# Patient Record
Sex: Male | Born: 1975 | Hispanic: Yes | Marital: Married | Smoking: Current some day smoker
Health system: Southern US, Community
[De-identification: ages and names within clinical notes are randomized; demographics above are authoritative.]

---

## 2018-07-19 ENCOUNTER — Encounter: Payer: Self-pay | Admitting: Emergency Medicine

## 2018-07-19 ENCOUNTER — Emergency Department: Payer: Self-pay

## 2018-07-19 ENCOUNTER — Emergency Department
Admission: EM | Admit: 2018-07-19 | Discharge: 2018-07-19 | Disposition: A | Discharge status: Home / Self Care | Payer: Self-pay | Attending: Emergency Medicine | Admitting: Emergency Medicine

## 2018-07-19 ENCOUNTER — Other Ambulatory Visit: Payer: Self-pay

## 2018-07-19 DIAGNOSIS — Z79899 Other long term (current) drug therapy: Secondary | ICD-10-CM | POA: Insufficient documentation

## 2018-07-19 DIAGNOSIS — F17290 Nicotine dependence, other tobacco product, uncomplicated: Secondary | ICD-10-CM | POA: Insufficient documentation

## 2018-07-19 DIAGNOSIS — R51 Headache: Secondary | ICD-10-CM | POA: Insufficient documentation

## 2018-07-19 DIAGNOSIS — M792 Neuralgia and neuritis, unspecified: Secondary | ICD-10-CM | POA: Insufficient documentation

## 2018-07-19 LAB — CBC
HEMATOCRIT: 42.6 % (ref 39.0–52.0)
HEMOGLOBIN: 14.1 g/dL (ref 13.0–17.0)
MCH: 28.5 pg (ref 26.0–34.0)
MCHC: 33.1 g/dL (ref 30.0–36.0)
MCV: 86.1 fL (ref 80.0–100.0)
NRBC: 0 % (ref 0.0–0.2)
PLATELETS: 456 10*3/uL — AB (ref 150–400)
RBC: 4.95 MIL/uL (ref 4.22–5.81)
RDW: 13.4 % (ref 11.5–15.5)
WBC: 8.1 10*3/uL (ref 4.0–10.5)

## 2018-07-19 LAB — BASIC METABOLIC PANEL
ANION GAP: 11 (ref 5–15)
BUN: 10 mg/dL (ref 6–20)
CALCIUM: 8.8 mg/dL — AB (ref 8.9–10.3)
CHLORIDE: 102 mmol/L (ref 98–111)
CO2: 27 mmol/L (ref 22–32)
Creatinine, Ser: 0.7 mg/dL (ref 0.61–1.24)
GFR calc non Af Amer: >60 mL/min (ref >60)
Glucose, Bld: 119 mg/dL — ABNORMAL HIGH (ref 70–99)
POTASSIUM: 3.9 mmol/L (ref 3.5–5.1)
Sodium: 140 mmol/L (ref 135–145)

## 2018-07-19 LAB — TROPONIN I: Troponin I: <0.03 ng/mL (ref <0.03)

## 2018-07-19 MED ORDER — CARBAMAZEPINE 100 MG PO CHEW: 100.0000 mg | CHEWABLE_TABLET | Freq: Two times a day (BID) | ORAL | 0 refills | Status: AC

## 2018-07-19 MED ORDER — CARBAMAZEPINE 100 MG PO CHEW
100.0000 mg | CHEWABLE_TABLET | Freq: Once | ORAL | Status: AC
  Administered 2018-07-19: 100 mg via ORAL
  Filled 2018-07-19 (×2): qty 1

## 2018-07-19 NOTE — ED Notes (Signed)
Spanish interpreter present for discharge instructions.

## 2018-07-19 NOTE — ED Notes (Signed)
MD and PA student at bedside. 

## 2018-07-19 NOTE — ED Provider Notes (Signed)
[facility] Emergency Department Provider Note   ____________________________________________   First MD Initiated Contact with Patient 07/19/18 1401     (approximate)  I have reviewed the triage vital signs and the nursing notes.   HISTORY  Chief Complaint Chest Pain and Headache    HPI Keith Silva is a 42 y.o. male patient reports that about a month ago he was at work at a different employer hit the side of his head and had about 3 days of pain.  He did not go to the ER or see anyone because he does not have any swelling in the pain went away.  This morning as he was getting ready for work at a different employer he developed shooting pain running from just above his ear and behind his ear down into the left shoulder.  This pain lasts a second or 2 is sharp and stabbing and severe and then goes away comes back a few seconds later.  Nothing seems to bring it on or make it worse.  History reviewed. No pertinent past medical history.  There are no active problems to display for this patient.   History reviewed. No pertinent surgical history.  Prior to Admission medications   Medication Sig Start Date End Date Taking? Authorizing Provider  carbamazepine (TEGRETOL) 100 MG chewable tablet Chew 1 tablet (100 mg total) by mouth 2 (two) times daily. Or you can dispense regular Tegretol 1 twice a day of the 100 mg pills 07/19/18   Arnaldo Natal, MD    Allergies Patient has no known allergies.  No family history on file.  Social History Social History   Tobacco Use  . Smoking status: Current Some Day Smoker    Types: Cigars  Substance Use Topics  . Alcohol use: Yes  . Drug use: Never    Review of Systems  Constitutional: No fever/chills Eyes: No visual changes. ENT: No sore throat. Cardiovascular: Denies chest pain. Respiratory: Denies shortness of breath. Gastrointestinal: No abdominal pain.  No nausea, no vomiting.  No diarrhea.  No  constipation. Genitourinary: Negative for dysuria. Musculoskeletal: Negative for back pain. Skin: Negative for rash. Neurological: See HPI   ____________________________________________   PHYSICAL EXAM:  VITAL SIGNS: ED Triage Vitals  Enc Vitals Group     BP 07/19/18 1029 123/75     Pulse Rate 07/19/18 1029 75     Resp 07/19/18 1029 18     Temp 07/19/18 1029 98.5 F (36.9 C)     Temp Source 07/19/18 1029 Oral     SpO2 07/19/18 1029 99 %     Weight 07/19/18 1030 195 lb (88.5 kg)     Height 07/19/18 1030 5\' 5"  (1.651 m)     Head Circumference --      Peak Flow --      Pain Score 07/19/18 1030 8     Pain Loc --      Pain Edu? --      Excl. in GC? --     Constitutional: Alert and oriented. Well appearing and in no acute distress. Eyes: Conjunctivae are normal. PERRL. EOMI. Head: Atraumatic. Nose: No congestion/rhinnorhea. Mouth/Throat: Mucous membranes are moist.  Oropharynx non-erythematous. Neck: No stridor.  Palpation of the neck and side of the head does not reproduce the patient's pain  cardiovascular: Normal rate, regular rhythm. Grossly normal heart sounds.  Good peripheral circulation. Respiratory: Normal respiratory effort.  No retractions. Lungs CTAB. Gastrointestinal: Soft and nontender. No distention. No abdominal bruits.  No CVA tenderness. Musculoskeletal: No lower extremity tenderness nor edema.  No joint effusions. Neurologic:  Normal speech and language. No gross focal neurologic deficits are appreciated.  Cranial nerves II through XII are intact there is no ataxia noted motor strength is normal in arms and legs and patient does not report any numbness no gait instability. Skin:  Skin is warm, dry and intact. No rash noted. Psychiatric: Mood and affect are normal. Speech and behavior are normal.  ____________________________________________   LABS (all labs ordered are listed, but only abnormal results are displayed)  Labs Reviewed  BASIC METABOLIC  PANEL - Abnormal; Notable for the following components:      Result Value   Glucose, Bld 119 (*)    Calcium 8.8 (*)    All other components within normal limits  CBC - Abnormal; Notable for the following components:   Platelets 456 (*)    All other components within normal limits  TROPONIN I   ____________________________________________  EKG  EKG read and interpreted by me shows normal sinus rhythm rate of 68 normal axis no acute ST-T wave changes ____________________________________________  RADIOLOGY  ED MD interpretation: Chest x-ray read by radiology reviewed by me shows some coarse markings however the patient has absolutely no lung symptoms no cough no fever no chills lungs are clear not sure the etiology of these markings but I do not believe there will cause a problem currently. CT read by radiologist only shows some thickening of the bilateral ethmoid sinuses there is no intracranial pathology  Official radiology report(s): Dg Chest 2 View  Result Date: 07/19/2018 CLINICAL DATA:  Intermittent chest pain began around 9:45 a.m. and the patient reports radiation of pain into the neck and left arm with persistent left-sided headache. Current smoker. EXAM: CHEST - 2 VIEW COMPARISON:  None. FINDINGS: The lungs are adequately inflated. The interstitial markings are coarse in the lower lobes greatest on the right. The heart and pulmonary vascularity are normal. There is no pleural effusion. 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 IMPRESSION: Coarse basilar lung markings greatest on the right may reflect subsegmental atelectasis or early pneumonia. No pulmonary edema. No pleural effusion or pneumothorax. Electronically Signed   By: David  [place] M.D.   On: 07/19/2018 10:58   Ct Head Wo Contrast  Result Date: 07/19/2018 CLINICAL DATA:  Left arm and neck pain extending into the left head. EXAM: CT HEAD WITHOUT CONTRAST TECHNIQUE: Contiguous axial images were  obtained from the base of the skull through the vertex without intravenous contrast. COMPARISON:  None. FINDINGS: Brain: No evidence of acute infarction, hemorrhage, hydrocephalus, extra-axial collection or mass lesion/mass effect. Vascular: No hyperdense vessel or unexpected calcification. Skull: Normal. Negative for fracture or focal lesion. Sinuses/Orbits: Minimal mucoperiosteal thickening of bilateral ethmoid sinuses are noted. Other: None. IMPRESSION: No focal acute intracranial abnormality identified. Minimal mucoperiosteal thickening of bilateral ethmoid sinuses. Electronically Signed   By: Sherian Rein M.D.   On: 07/19/2018 14:47    ____________________________________________   PROCEDURES  Procedure(s) performed: Discussed patient with Dr. Thad Ranger.  Because the patient is bothered quite a lot by the shooting headache pains that seem like there consistent with tic douloureux although not in the right place we will begin treatment with Tegretol and have a follow-up.  Procedures  Critical Care performed:   ____________________________________________   INITIAL IMPRESSION / ASSESSMENT AND PLAN / ED COURSE  Reviewed the patient's discharge instructions with him got him the first dose of Tegretol will let  him go he will return for any further problems      ____________________________________________   FINAL CLINICAL IMPRESSION(S) / ED DIAGNOSES  Final diagnoses:  Neuropathic pain     ED Discharge Orders         Ordered    carbamazepine (TEGRETOL) 100 MG chewable tablet  2 times daily     07/19/18 1532           Note:  This document was prepared using Dragon voice recognition software and may include unintentional dictation errors.    Arnaldo Natal, MD 07/19/18 1622

## 2018-07-19 NOTE — ED Triage Notes (Signed)
Patient reports intermittent chest pain that started approximately 945. Reports shooting pain to left side of chest and up left arm into neck and head. Reports constant headache on left side of head. Patient denies N/V or SOB. Denies history of same.

## 2018-07-19 NOTE — ED Notes (Signed)
Pt ambulatory to treatment room with steady gait.  

## 2018-07-19 NOTE — ED Notes (Signed)
Pt ambulatory to toilet with steady gait noted.  

## 2018-07-19 NOTE — Discharge Instructions (Addendum)
Take the Tegretol 100 mg pill twice a day.  Call Dr. Malvin Johns or Dr. Sherryll Burger the neurologist to arrange follow-up.    You can also follow-up with primary care.  You can see [facility] or the [facility] or the [place] [facility] or the [employer] clinic or [employer] care.    They can evaluate you and see if you need to increase the dose of the Tegretol.  Please return here for worse pain, fever or feeling sicker.    If the pain is continuing and you have not been able to see a doctor within about a week or 2 return here for recheck. ================ Tome la medicina de Tegretol 100 mg [employer].  Llame al Dr. Malvin Johns o al Dr. Kipp Laurence neurlogo para hacer una sita de seguimiento.    Tambin puede hacer una cita seguimiento con su doctor de cabezera.  Puede tambin haver un seguimeinto con la Comptroller o la clnica [facility] o la clnica [facility] o la clnica [employer] o el [facility] [place], para una evaluacin y ver si necesitan aumentar la dosis del Tegretol.  Por favor, regrese  ala sala de emergencias si emperora la fiebre, dolores o sentirse ms enfermo.    Si los dolores continan y usted no ha podido Patent attorney a Civil engineer, contracting las [employer], regrese a la sala de emergencias para ms evaluacin.

## 2019-11-08 IMAGING — CT CT HEAD W/O CM
3 series · 16 of 47 positions shown, 19 images · non-contrast
Comparison: None.

CLINICAL DATA: Left arm and neck pain extending into the left head.

EXAM:
CT HEAD WITHOUT CONTRAST
TECHNIQUE: Contiguous axial images were obtained from the base of the skull
through the vertex without intravenous contrast.

[Series 3: head wo · axial · 0.43mm/px · z∈[-111,+14]mm · 10 of 30 slices shown, 13 images]
[im 3/30  brain]
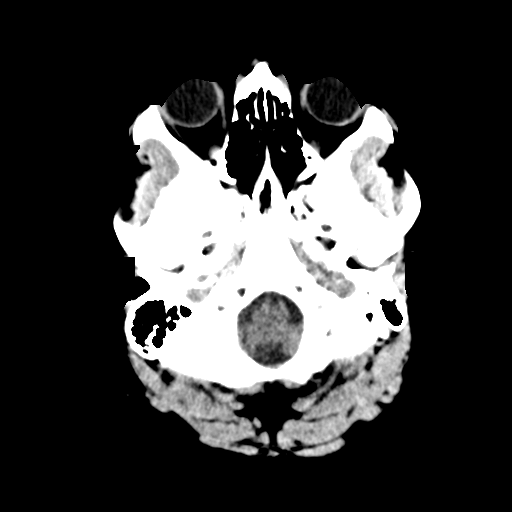
[im 3/30  bone]
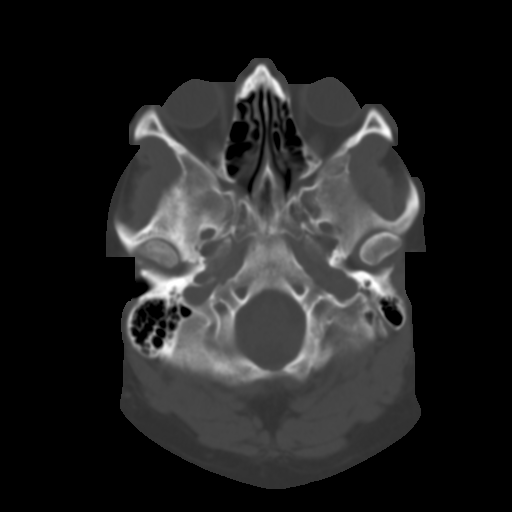
[im 6/30  brain]
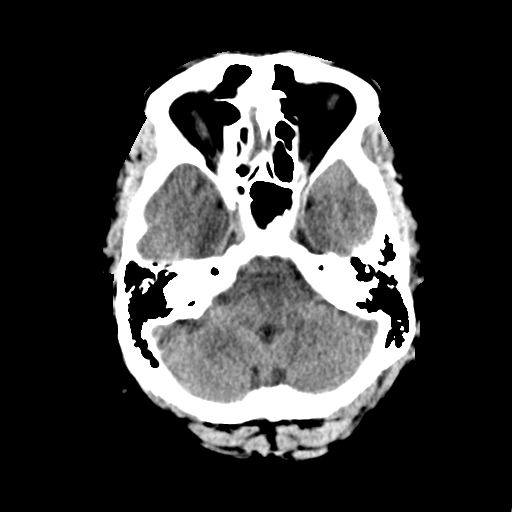
[im 9/30  brain]
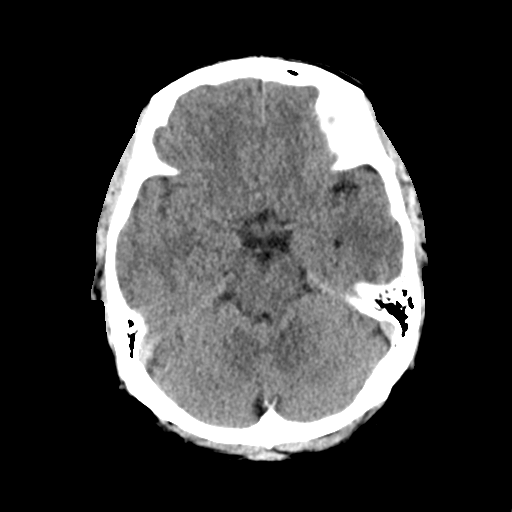
[im 11/30  brain]
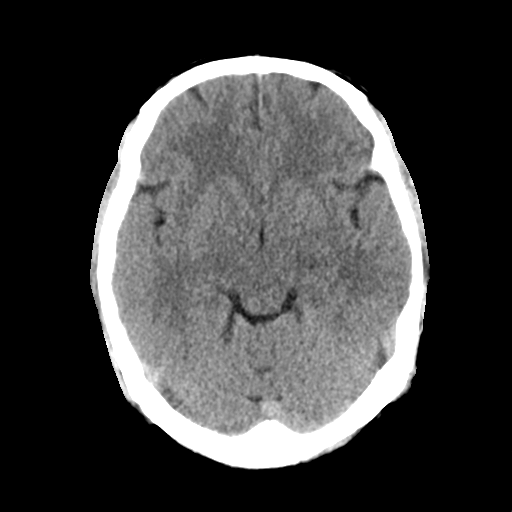
[im 14/30  brain]
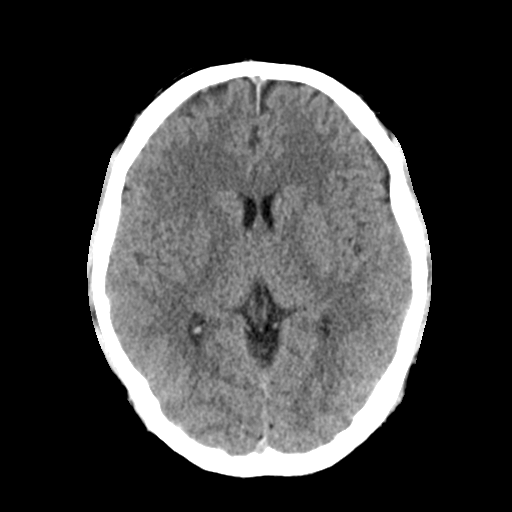
[im 14/30  bone]
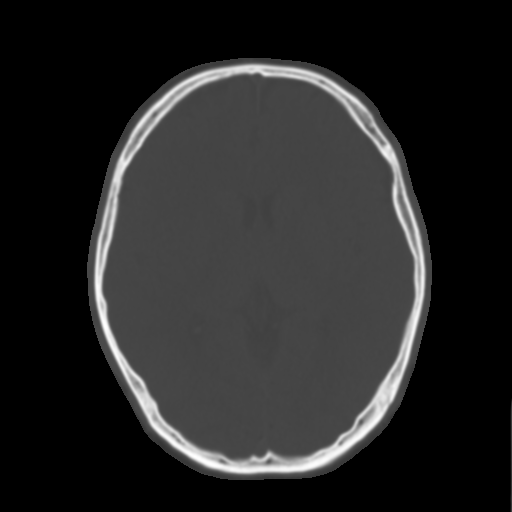
[im 17/30  brain]
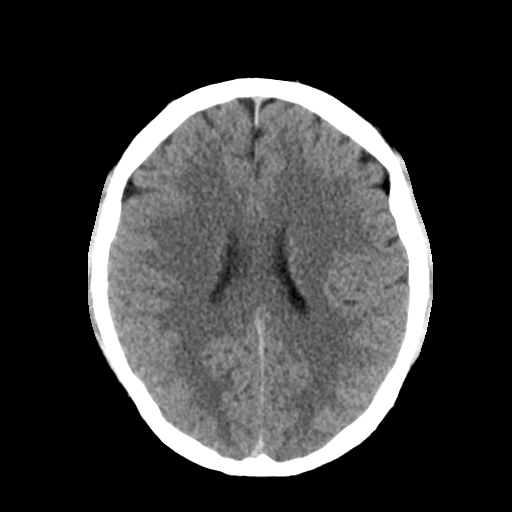
[im 20/30  brain]
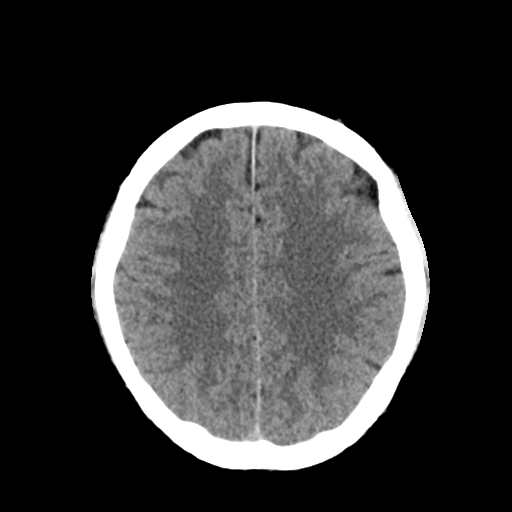
[im 23/30  brain]
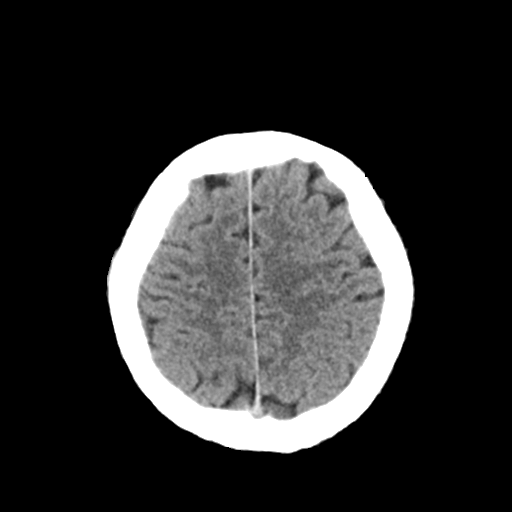
[im 25/30  brain]
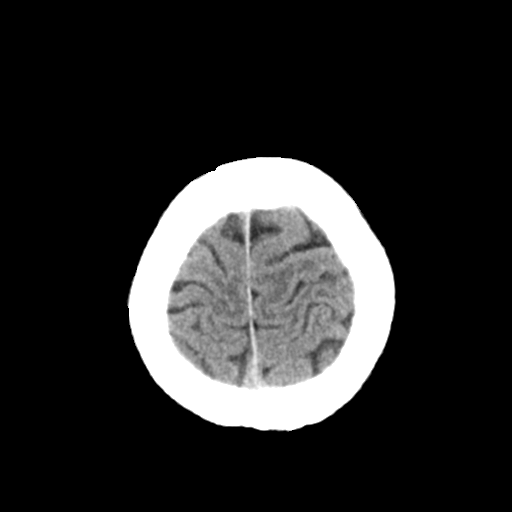
[im 25/30  bone]
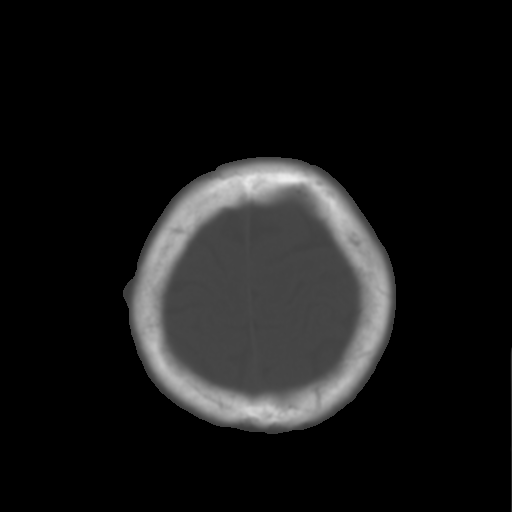
[im 28/30  brain]
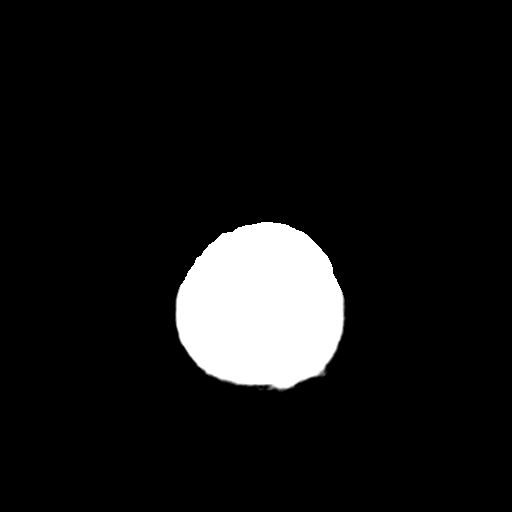

[Series 4: coronal soft tissue · coronal · 0.32mm/px · 3 of 65 slices shown]
[im 22/65  brain]
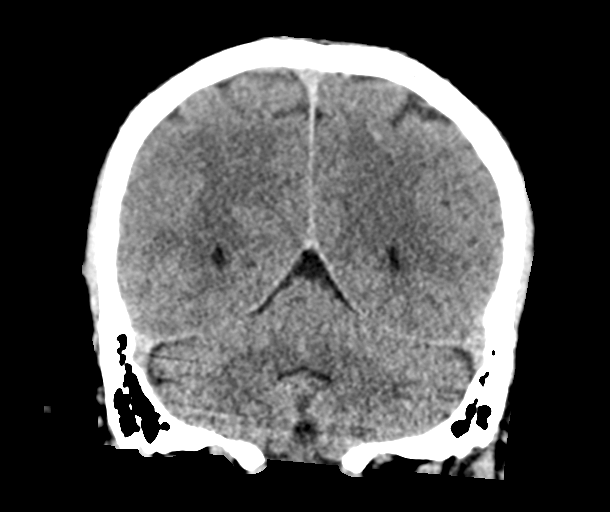
[im 29/65  brain]
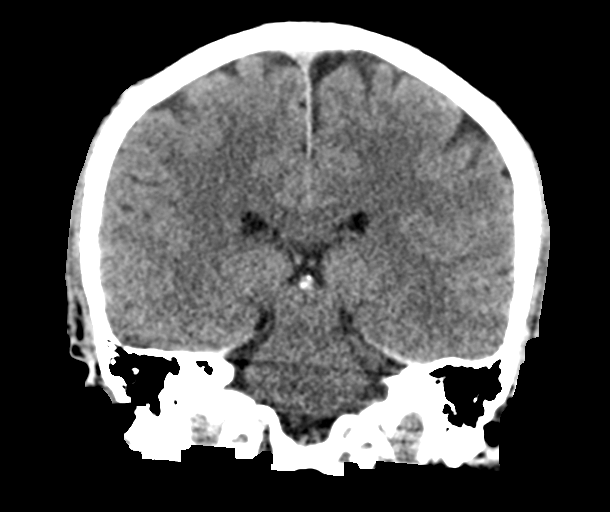
[im 36/65  brain]
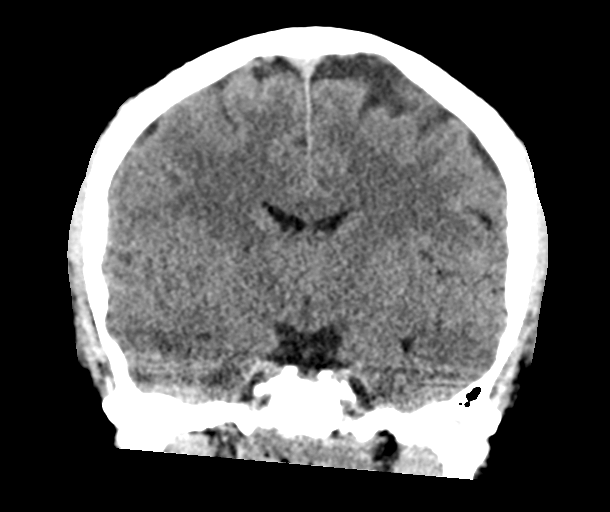

[Series 5: sagittal soft tissue · sagittal · 0.34mm/px · 3 of 56 slices shown]
[im 19/56  brain]
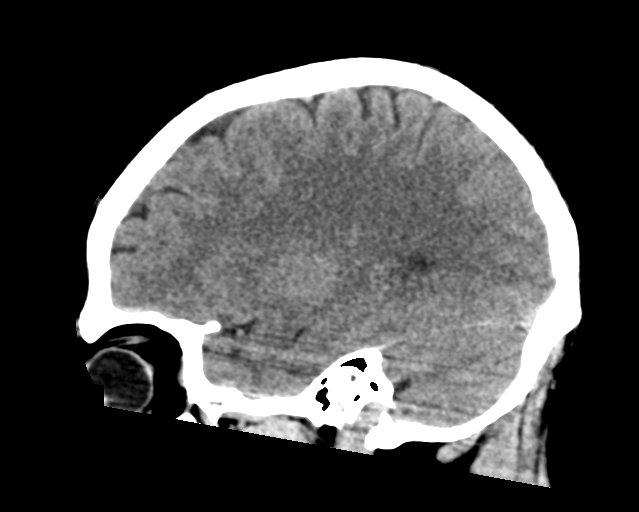
[im 28/56  brain]
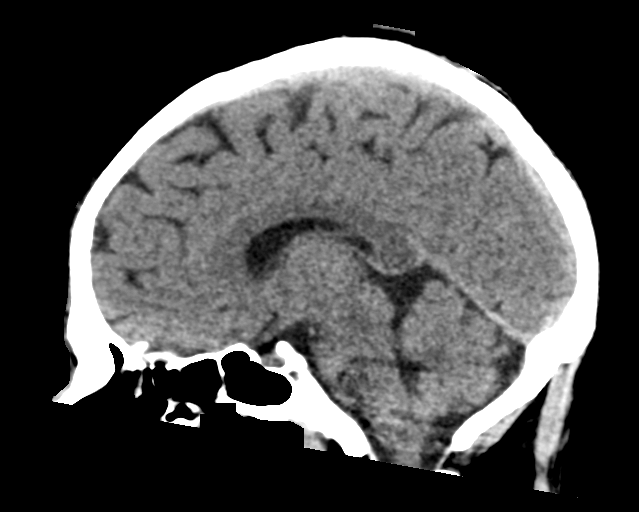
[im 37/56  brain]
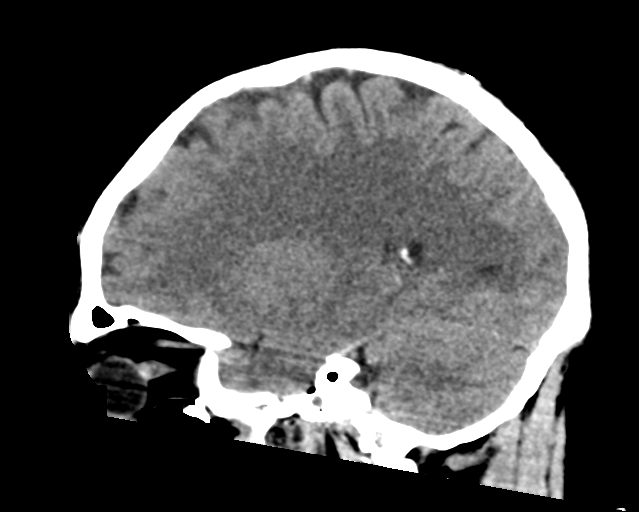

[16 of 47 positions shown; findings below may reference images not displayed]

FINDINGS: Brain: No evidence of acute infarction, hemorrhage, hydrocephalus,
extra-axial collection or mass lesion/mass effect.

Vascular: No hyperdense vessel or unexpected calcification.

Skull: Normal. Negative for fracture or focal lesion.

Sinuses/Orbits: Minimal mucoperiosteal thickening of bilateral
ethmoid sinuses are noted.

Other: None.
IMPRESSION: No focal acute intracranial abnormality identified.

Minimal mucoperiosteal thickening of bilateral ethmoid sinuses.
